# Patient Record
Sex: Female | Born: 1935 | Race: White | Hispanic: No | Marital: Married
Health system: Southern US, Community
[De-identification: ages and names within clinical notes are randomized; demographics above are authoritative.]

---

## 2006-03-19 ENCOUNTER — Inpatient Hospital Stay: Payer: Self-pay | Admitting: Internal Medicine

## 2006-03-19 ENCOUNTER — Other Ambulatory Visit: Payer: Self-pay

## 2006-03-20 ENCOUNTER — Other Ambulatory Visit: Payer: Self-pay

## 2006-03-21 ENCOUNTER — Other Ambulatory Visit: Payer: Self-pay

## 2006-03-22 ENCOUNTER — Other Ambulatory Visit: Payer: Self-pay

## 2007-03-27 ENCOUNTER — Emergency Department: Payer: Self-pay

## 2007-03-27 ENCOUNTER — Other Ambulatory Visit: Payer: Self-pay

## 2007-08-16 ENCOUNTER — Ambulatory Visit: Payer: Self-pay | Admitting: Family

## 2008-09-11 ENCOUNTER — Ambulatory Visit: Payer: Self-pay | Admitting: Family

## 2008-12-12 ENCOUNTER — Inpatient Hospital Stay: Payer: Self-pay | Admitting: Internal Medicine

## 2008-12-18 ENCOUNTER — Ambulatory Visit: Payer: Self-pay | Admitting: Internal Medicine

## 2009-07-01 ENCOUNTER — Ambulatory Visit: Payer: Self-pay | Admitting: Internal Medicine

## 2009-10-01 ENCOUNTER — Ambulatory Visit: Payer: Self-pay | Admitting: Family

## 2011-02-15 ENCOUNTER — Emergency Department: Payer: Self-pay | Admitting: Emergency Medicine

## 2011-09-14 ENCOUNTER — Ambulatory Visit: Payer: Self-pay | Admitting: Internal Medicine

## 2011-09-23 ENCOUNTER — Inpatient Hospital Stay: Payer: Self-pay | Admitting: Internal Medicine

## 2012-06-09 ENCOUNTER — Ambulatory Visit: Payer: Self-pay | Admitting: Internal Medicine

## 2012-06-20 ENCOUNTER — Inpatient Hospital Stay: Payer: Self-pay | Admitting: Internal Medicine

## 2012-06-20 LAB — PRO B NATRIURETIC PEPTIDE: B-Type Natriuretic Peptide: 12799 pg/mL — ABNORMAL HIGH (ref 0–450)

## 2012-06-20 LAB — TROPONIN I
Troponin-I: <0.02 ng/mL
Troponin-I: <0.02 ng/mL

## 2012-06-20 LAB — CBC
HCT: 39.4 % (ref 35.0–47.0)
MCHC: 33 g/dL (ref 32.0–36.0)
Platelet: 184 10*3/uL (ref 150–440)
RBC: 4.55 10*6/uL (ref 3.80–5.20)
WBC: 14.1 10*3/uL — ABNORMAL HIGH (ref 3.6–11.0)

## 2012-06-20 LAB — BASIC METABOLIC PANEL
Anion Gap: 11 (ref 7–16)
BUN: 20 mg/dL — ABNORMAL HIGH (ref 7–18)
Calcium, Total: 8.9 mg/dL (ref 8.5–10.1)
Chloride: 104 mmol/L (ref 98–107)
Co2: 25 mmol/L (ref 21–32)
Creatinine: 1.08 mg/dL (ref 0.60–1.30)

## 2012-06-20 LAB — CK TOTAL AND CKMB (NOT AT ARMC)
CK, Total: 50 U/L (ref 21–215)
CK, Total: 55 U/L (ref 21–215)
CK-MB: 0.8 ng/mL (ref 0.5–3.6)

## 2012-06-21 LAB — CBC WITH DIFFERENTIAL/PLATELET
Basophil #: 0 10*3/uL (ref 0.0–0.1)
Basophil %: 0.5 %
Eosinophil %: 1.2 %
HGB: 11.6 g/dL — ABNORMAL LOW (ref 12.0–16.0)
Lymphocyte %: 13 %
MCH: 28.8 pg (ref 26.0–34.0)
Monocyte #: 0.7 x10 3/mm (ref 0.2–0.9)
Monocyte %: 7.4 %
Neutrophil %: 77.9 %
Platelet: 151 10*3/uL (ref 150–440)
RBC: 4.04 10*6/uL (ref 3.80–5.20)
WBC: 9.2 10*3/uL (ref 3.6–11.0)

## 2012-06-21 LAB — BASIC METABOLIC PANEL
Anion Gap: 12 (ref 7–16)
BUN: 22 mg/dL — ABNORMAL HIGH (ref 7–18)
Chloride: 105 mmol/L (ref 98–107)
Creatinine: 1.08 mg/dL (ref 0.60–1.30)
EGFR (Non-African Amer.): 44 — ABNORMAL LOW
Glucose: 95 mg/dL (ref 65–99)
Potassium: 3.7 mmol/L (ref 3.5–5.1)
Sodium: 142 mmol/L (ref 136–145)

## 2012-06-21 LAB — HEMOGLOBIN A1C: Hemoglobin A1C: 5.8 % (ref 4.2–6.3)

## 2012-06-21 LAB — LIPID PANEL
Cholesterol: 91 mg/dL (ref 0–200)
Triglycerides: 89 mg/dL (ref 0–200)

## 2012-06-21 LAB — MAGNESIUM: Magnesium: 1.8 mg/dL

## 2012-08-09 ENCOUNTER — Ambulatory Visit: Payer: Self-pay | Admitting: Internal Medicine

## 2012-08-25 ENCOUNTER — Inpatient Hospital Stay: Payer: Self-pay | Admitting: Internal Medicine

## 2012-08-25 LAB — URINALYSIS, COMPLETE
Bilirubin,UR: NEGATIVE
Blood: NEGATIVE
Glucose,UR: NEGATIVE mg/dL (ref 0–75)
Hyaline Cast: 3
Ketone: NEGATIVE
Leukocyte Esterase: NEGATIVE
Ph: 7 (ref 4.5–8.0)
Specific Gravity: 1.009 (ref 1.003–1.030)
Squamous Epithelial: <1
WBC UR: <1 /HPF (ref 0–5)

## 2012-08-25 LAB — COMPREHENSIVE METABOLIC PANEL
Albumin: 2.7 g/dL — ABNORMAL LOW (ref 3.4–5.0)
Alkaline Phosphatase: 73 U/L (ref 50–136)
Anion Gap: 8 (ref 7–16)
BUN: 31 mg/dL — ABNORMAL HIGH (ref 7–18)
Calcium, Total: 8.4 mg/dL — ABNORMAL LOW (ref 8.5–10.1)
Co2: 27 mmol/L (ref 21–32)
EGFR (Non-African Amer.): 42 — ABNORMAL LOW
Glucose: 105 mg/dL — ABNORMAL HIGH (ref 65–99)
Osmolality: 277 (ref 275–301)
Potassium: 5.2 mmol/L — ABNORMAL HIGH (ref 3.5–5.1)
SGOT(AST): 48 U/L — ABNORMAL HIGH (ref 15–37)
SGPT (ALT): 13 U/L (ref 12–78)
Sodium: 135 mmol/L — ABNORMAL LOW (ref 136–145)

## 2012-08-25 LAB — CBC
HGB: 13.2 g/dL (ref 12.0–16.0)
MCHC: 33 g/dL (ref 32.0–36.0)
MCV: 84 fL (ref 80–100)
Platelet: 180 10*3/uL (ref 150–440)
RDW: 13.9 % (ref 11.5–14.5)
WBC: 15.1 10*3/uL — ABNORMAL HIGH (ref 3.6–11.0)

## 2012-08-25 LAB — TROPONIN I
Troponin-I: <0.02 ng/mL
Troponin-I: <0.02 ng/mL

## 2012-08-25 LAB — APTT: Activated PTT: 25.7 secs (ref 23.6–35.9)

## 2012-08-25 LAB — CK TOTAL AND CKMB (NOT AT ARMC)
CK, Total: 35 U/L (ref 21–215)
CK-MB: <0.5 ng/mL — ABNORMAL LOW (ref 0.5–3.6)

## 2012-08-25 LAB — PROTIME-INR: Prothrombin Time: 14.1 secs (ref 11.5–14.7)

## 2012-08-26 ENCOUNTER — Ambulatory Visit: Payer: Self-pay | Admitting: Internal Medicine

## 2012-08-26 LAB — BASIC METABOLIC PANEL
Anion Gap: 8 (ref 7–16)
BUN: 28 mg/dL — ABNORMAL HIGH (ref 7–18)
Calcium, Total: 8.2 mg/dL — ABNORMAL LOW (ref 8.5–10.1)
Chloride: 102 mmol/L (ref 98–107)
Co2: 28 mmol/L (ref 21–32)
EGFR (African American): 44 — ABNORMAL LOW
Glucose: 101 mg/dL — ABNORMAL HIGH (ref 65–99)
Osmolality: 281 (ref 275–301)

## 2012-08-28 LAB — BASIC METABOLIC PANEL
BUN: 24 mg/dL — ABNORMAL HIGH (ref 7–18)
Calcium, Total: 8.5 mg/dL (ref 8.5–10.1)
Co2: 29 mmol/L (ref 21–32)
Creatinine: 1.12 mg/dL (ref 0.60–1.30)
EGFR (African American): 48 — ABNORMAL LOW
EGFR (Non-African Amer.): 42 — ABNORMAL LOW
Glucose: 102 mg/dL — ABNORMAL HIGH (ref 65–99)
Potassium: 4.2 mmol/L (ref 3.5–5.1)
Sodium: 135 mmol/L — ABNORMAL LOW (ref 136–145)

## 2012-08-28 LAB — CBC WITH DIFFERENTIAL/PLATELET
Eosinophil #: 0.4 10*3/uL (ref 0.0–0.7)
Lymphocyte #: 1 10*3/uL (ref 1.0–3.6)
MCH: 27 pg (ref 26.0–34.0)
MCHC: 32.5 g/dL (ref 32.0–36.0)
MCV: 83 fL (ref 80–100)
Monocyte #: 0.6 x10 3/mm (ref 0.2–0.9)
Neutrophil %: 82 %
Platelet: 189 10*3/uL (ref 150–440)
RBC: 4.64 10*6/uL (ref 3.80–5.20)
RDW: 13.7 % (ref 11.5–14.5)

## 2012-08-28 LAB — MAGNESIUM: Magnesium: 2.1 mg/dL

## 2012-08-29 LAB — URINALYSIS, COMPLETE
Bilirubin,UR: NEGATIVE
Blood: NEGATIVE
Glucose,UR: NEGATIVE mg/dL (ref 0–75)
Hyaline Cast: 43
Ketone: NEGATIVE
Ph: 6 (ref 4.5–8.0)
Protein: NEGATIVE
RBC,UR: 1 /HPF (ref 0–5)
Squamous Epithelial: <1

## 2012-08-30 LAB — DIGOXIN LEVEL: Digoxin: 1.54 ng/mL

## 2012-08-30 LAB — CULTURE, BLOOD (SINGLE)

## 2012-10-18 ENCOUNTER — Ambulatory Visit: Payer: Self-pay | Admitting: Internal Medicine

## 2012-11-07 LAB — COMPREHENSIVE METABOLIC PANEL
Albumin: 3.3 g/dL — ABNORMAL LOW (ref 3.4–5.0)
BUN: 14 mg/dL (ref 7–18)
Bilirubin,Total: 0.6 mg/dL (ref 0.2–1.0)
Chloride: 100 mmol/L (ref 98–107)
Creatinine: 1.07 mg/dL (ref 0.60–1.30)
EGFR (African American): 51 — ABNORMAL LOW
EGFR (Non-African Amer.): 44 — ABNORMAL LOW
Glucose: 102 mg/dL — ABNORMAL HIGH (ref 65–99)
SGOT(AST): 24 U/L (ref 15–37)
SGPT (ALT): 15 U/L (ref 12–78)
Total Protein: 6.7 g/dL (ref 6.4–8.2)

## 2012-11-07 LAB — CBC
HCT: 39.3 % (ref 35.0–47.0)
MCV: 82 fL (ref 80–100)
RBC: 4.82 10*6/uL (ref 3.80–5.20)
WBC: 15.3 10*3/uL — ABNORMAL HIGH (ref 3.6–11.0)

## 2012-11-07 LAB — PRO B NATRIURETIC PEPTIDE: B-Type Natriuretic Peptide: 12328 pg/mL — ABNORMAL HIGH (ref 0–450)

## 2012-11-08 ENCOUNTER — Inpatient Hospital Stay: Payer: Self-pay | Admitting: Family Medicine

## 2012-11-08 LAB — URINALYSIS, COMPLETE
Bacteria: NONE SEEN
Bilirubin,UR: NEGATIVE
Blood: NEGATIVE
Glucose,UR: NEGATIVE mg/dL (ref 0–75)
Ketone: NEGATIVE
Leukocyte Esterase: NEGATIVE
Ph: 5 (ref 4.5–8.0)
Specific Gravity: 1.014 (ref 1.003–1.030)
WBC UR: 1 /HPF (ref 0–5)

## 2012-11-08 LAB — CBC WITH DIFFERENTIAL/PLATELET
Basophil %: 0.3 %
Eosinophil %: 0.4 %
Monocyte %: 7.4 %
Neutrophil #: 9.4 10*3/uL — ABNORMAL HIGH (ref 1.4–6.5)
Neutrophil %: 82.9 %
Platelet: 158 10*3/uL (ref 150–440)
RBC: 4.36 10*6/uL (ref 3.80–5.20)
WBC: 11.3 10*3/uL — ABNORMAL HIGH (ref 3.6–11.0)

## 2012-11-08 LAB — MAGNESIUM: Magnesium: 1.9 mg/dL

## 2012-11-08 LAB — TSH: Thyroid Stimulating Horm: 1.3 u[IU]/mL

## 2012-11-09 LAB — URINALYSIS, COMPLETE
Bilirubin,UR: NEGATIVE
Hyaline Cast: 3
Ketone: NEGATIVE
Ph: 6 (ref 4.5–8.0)
RBC,UR: 28 /HPF (ref 0–5)
Specific Gravity: 1.018 (ref 1.003–1.030)
Squamous Epithelial: 4
WBC UR: 40 /HPF (ref 0–5)

## 2012-11-09 LAB — CBC WITH DIFFERENTIAL/PLATELET
Basophil %: 0.5 %
Eosinophil #: 0 10*3/uL (ref 0.0–0.7)
Eosinophil %: 0.3 %
HCT: 36.2 % (ref 35.0–47.0)
HGB: 12.1 g/dL (ref 12.0–16.0)
Lymphocyte #: 0.9 10*3/uL — ABNORMAL LOW (ref 1.0–3.6)
Lymphocyte %: 7.1 %
Monocyte #: 0.8 x10 3/mm (ref 0.2–0.9)
Monocyte %: 6.4 %
RDW: 15.2 % — ABNORMAL HIGH (ref 11.5–14.5)
WBC: 12.8 10*3/uL — ABNORMAL HIGH (ref 3.6–11.0)

## 2012-11-09 LAB — BASIC METABOLIC PANEL
Anion Gap: 5 — ABNORMAL LOW (ref 7–16)
BUN: 16 mg/dL (ref 7–18)
Calcium, Total: 8.1 mg/dL — ABNORMAL LOW (ref 8.5–10.1)
Creatinine: 0.9 mg/dL (ref 0.60–1.30)
EGFR (African American): >60
EGFR (Non-African Amer.): 54 — ABNORMAL LOW
Glucose: 108 mg/dL — ABNORMAL HIGH (ref 65–99)
Osmolality: 276 (ref 275–301)
Potassium: 3.7 mmol/L (ref 3.5–5.1)
Sodium: 137 mmol/L (ref 136–145)

## 2012-11-09 LAB — URINE CULTURE

## 2012-11-09 LAB — LIPID PANEL
Cholesterol: 117 mg/dL (ref 0–200)
Ldl Cholesterol, Calc: 69 mg/dL (ref 0–100)
Triglycerides: 111 mg/dL (ref 0–200)
VLDL Cholesterol, Calc: 22 mg/dL (ref 5–40)

## 2012-11-10 LAB — CBC WITH DIFFERENTIAL/PLATELET
Basophil #: 0.1 10*3/uL (ref 0.0–0.1)
Basophil %: 0.6 %
Eosinophil #: 0 10*3/uL (ref 0.0–0.7)
HGB: 12 g/dL (ref 12.0–16.0)
MCH: 27.2 pg (ref 26.0–34.0)
MCHC: 32.9 g/dL (ref 32.0–36.0)
MCV: 83 fL (ref 80–100)
Monocyte #: 0.8 x10 3/mm (ref 0.2–0.9)
Neutrophil #: 10.2 10*3/uL — ABNORMAL HIGH (ref 1.4–6.5)
Neutrophil %: 83.9 %
RDW: 15.5 % — ABNORMAL HIGH (ref 11.5–14.5)

## 2012-11-10 LAB — BASIC METABOLIC PANEL
Anion Gap: 6 — ABNORMAL LOW (ref 7–16)
Calcium, Total: 8.1 mg/dL — ABNORMAL LOW (ref 8.5–10.1)
Co2: 30 mmol/L (ref 21–32)
EGFR (African American): >60
EGFR (Non-African Amer.): >60
Osmolality: 275 (ref 275–301)

## 2012-11-10 LAB — URINE CULTURE

## 2012-11-10 LAB — WBCS, STOOL

## 2012-11-11 LAB — CBC WITH DIFFERENTIAL/PLATELET
Basophil #: 0.1 10*3/uL (ref 0.0–0.1)
Eosinophil #: 0.1 10*3/uL (ref 0.0–0.7)
HGB: 11.5 g/dL — ABNORMAL LOW (ref 12.0–16.0)
Lymphocyte #: 1.1 10*3/uL (ref 1.0–3.6)
Lymphocyte %: 9.3 %
MCH: 26.7 pg (ref 26.0–34.0)
MCHC: 32.4 g/dL (ref 32.0–36.0)
Monocyte #: 1 x10 3/mm — ABNORMAL HIGH (ref 0.2–0.9)
Neutrophil %: 81 %
Platelet: 143 10*3/uL — ABNORMAL LOW (ref 150–440)
RBC: 4.32 10*6/uL (ref 3.80–5.20)
RDW: 15.4 % — ABNORMAL HIGH (ref 11.5–14.5)

## 2012-11-11 LAB — TROPONIN I: Troponin-I: 0.04 ng/mL

## 2012-11-12 LAB — CBC WITH DIFFERENTIAL/PLATELET
Basophil %: 0.5 %
Eosinophil #: 0.1 10*3/uL (ref 0.0–0.7)
HGB: 11.6 g/dL — ABNORMAL LOW (ref 12.0–16.0)
MCH: 27.3 pg (ref 26.0–34.0)
MCHC: 33.2 g/dL (ref 32.0–36.0)
MCV: 82 fL (ref 80–100)
Monocyte #: 0.8 x10 3/mm (ref 0.2–0.9)
Platelet: 138 10*3/uL — ABNORMAL LOW (ref 150–440)
RDW: 15.4 % — ABNORMAL HIGH (ref 11.5–14.5)

## 2012-11-12 LAB — BASIC METABOLIC PANEL
Anion Gap: 7 (ref 7–16)
Calcium, Total: 8 mg/dL — ABNORMAL LOW (ref 8.5–10.1)
Co2: 30 mmol/L (ref 21–32)
Creatinine: 0.78 mg/dL (ref 0.60–1.30)
EGFR (African American): >60
Glucose: 89 mg/dL (ref 65–99)
Sodium: 138 mmol/L (ref 136–145)

## 2012-11-13 ENCOUNTER — Ambulatory Visit: Payer: Self-pay | Admitting: Internal Medicine

## 2012-11-13 LAB — CULTURE, BLOOD (SINGLE)

## 2012-11-13 LAB — STOOL CULTURE

## 2012-11-21 ENCOUNTER — Emergency Department: Payer: Self-pay | Admitting: Emergency Medicine

## 2012-11-21 LAB — URINALYSIS, COMPLETE
Bacteria: NONE SEEN
Bilirubin,UR: NEGATIVE
Blood: NEGATIVE
Glucose,UR: NEGATIVE mg/dL (ref 0–75)
Nitrite: NEGATIVE
Protein: NEGATIVE
Specific Gravity: 1.006 (ref 1.003–1.030)

## 2012-11-21 LAB — COMPREHENSIVE METABOLIC PANEL
BUN: 13 mg/dL (ref 7–18)
Calcium, Total: 8.1 mg/dL — ABNORMAL LOW (ref 8.5–10.1)
Chloride: 101 mmol/L (ref 98–107)
Co2: 28 mmol/L (ref 21–32)
EGFR (African American): >60
EGFR (Non-African Amer.): 55 — ABNORMAL LOW
SGOT(AST): 20 U/L (ref 15–37)
SGPT (ALT): 15 U/L (ref 12–78)
Sodium: 135 mmol/L — ABNORMAL LOW (ref 136–145)

## 2012-11-21 LAB — DIGOXIN LEVEL: Digoxin: >5 ng/mL

## 2012-11-21 LAB — CBC
HGB: 12.3 g/dL (ref 12.0–16.0)
MCH: 26.3 pg (ref 26.0–34.0)
MCHC: 31.6 g/dL — ABNORMAL LOW (ref 32.0–36.0)
RDW: 14.9 % — ABNORMAL HIGH (ref 11.5–14.5)
WBC: 16.4 10*3/uL — ABNORMAL HIGH (ref 3.6–11.0)

## 2012-12-14 ENCOUNTER — Ambulatory Visit: Payer: Self-pay | Admitting: Internal Medicine

## 2012-12-14 DEATH — deceased

## 2014-06-24 IMAGING — CR DG CHEST 2V
1 series · 2 of 2 positions shown · non-contrast
Comparison: none

REASON FOR EXAM: H/O  Chf pneumonia  STAT Report [id number]
COMMENTS:

[Series 1: w chest pa · 0.14mm/px · 2 of 2 slices shown]
[im 1/2]
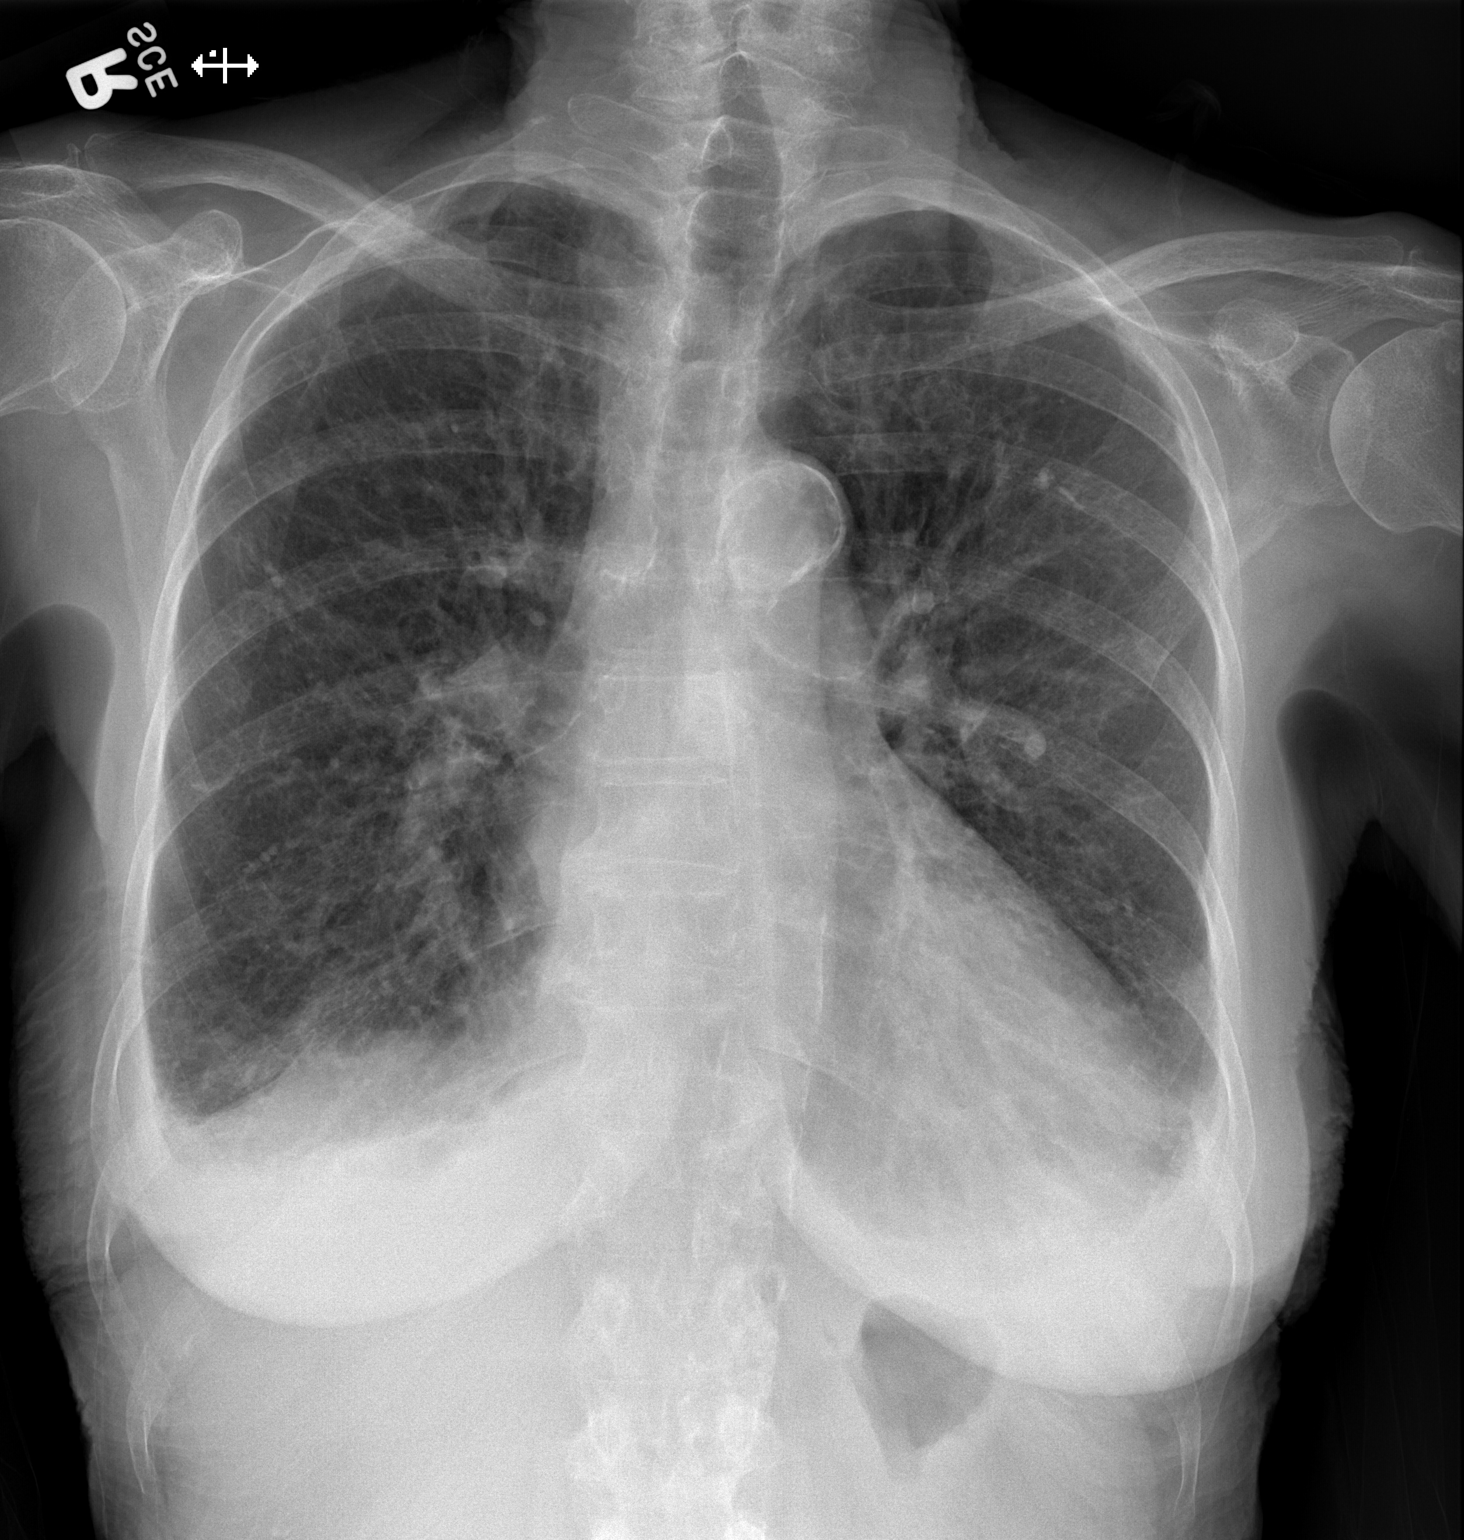
[im 2/2]
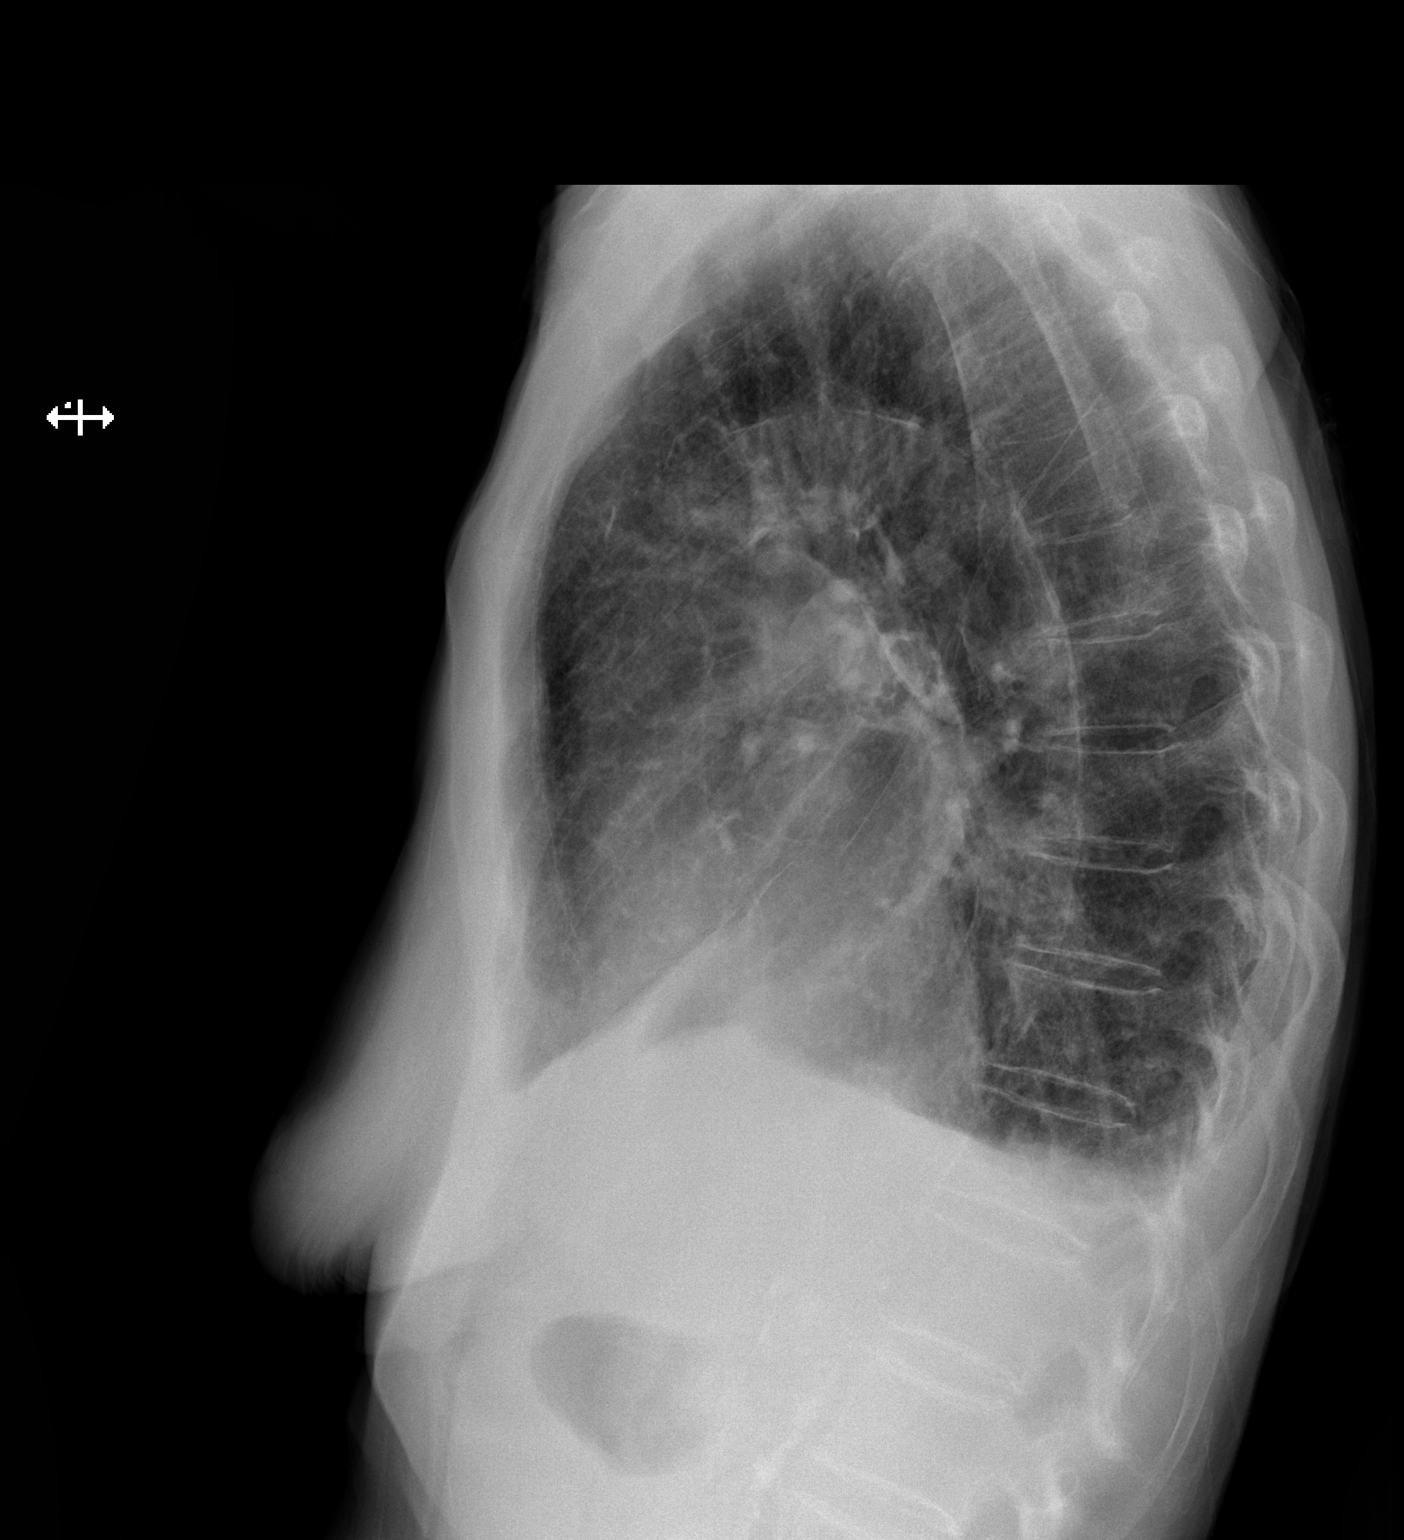

[2 of 2 positions shown; findings below may reference images not displayed]

PROCEDURE:     DXR - DXR CHEST PA (OR AP) AND LATERAL  - October 18, 2012  [DATE]

RESULT:     Comparison is made to the study 27 August, 2012.

The cardiac silhouette is enlarged. There are small bilateral pleural
effusions. The interstitial markings are diffusely prominent. This could be
secondary to chronic underlying pulmonary disease. The pleural effusion is
slightly smaller than seen previously. Atherosclerotic calcification is
present. No definite infiltrate is seen.
IMPRESSION: 1. Findings suggestive of COPD with chronic interstitial lung disease. Edema
would be difficult to exclude. There is stable cardiomegaly. Slightly
decreased small bilateral pleural effusions are noted.

[REDACTED]

## 2014-07-18 IMAGING — CT CT CHEST-ABD W/ CM
1 of 3 series · 12 of 31 positions shown, 18 images · non-contrast
Comparison: none

REASON FOR EXAM: abdominal pain epigastric.;    NOTE: Nursing to Give
Oral CT Contrast
COMMENTS:

[Series 2: soft tissue · axial · 0.66mm/px · z∈[-698,-326]mm · 12 of 150 slices shown, 18 images]
[im 13/150  mediastinal]
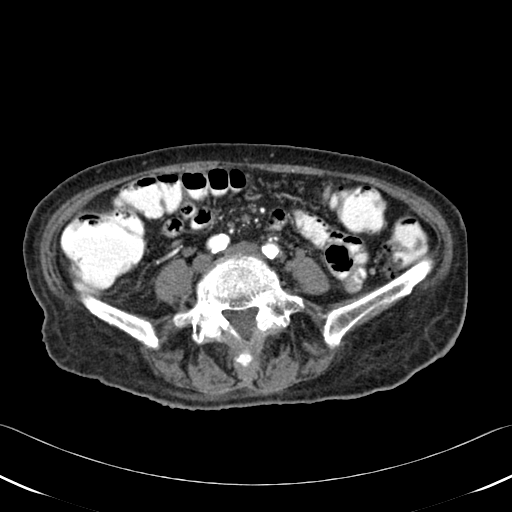
[im 13/150  bone]
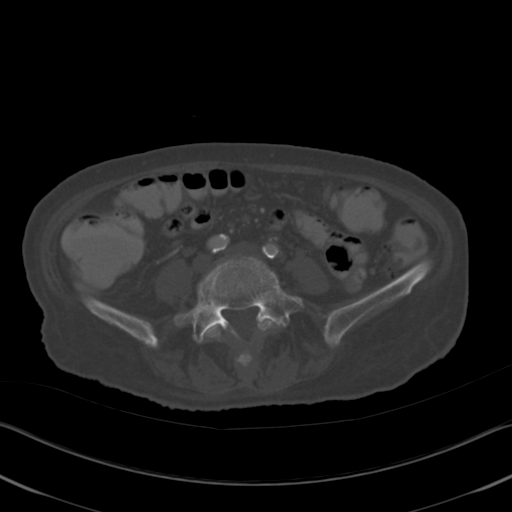
[im 25/150  mediastinal]
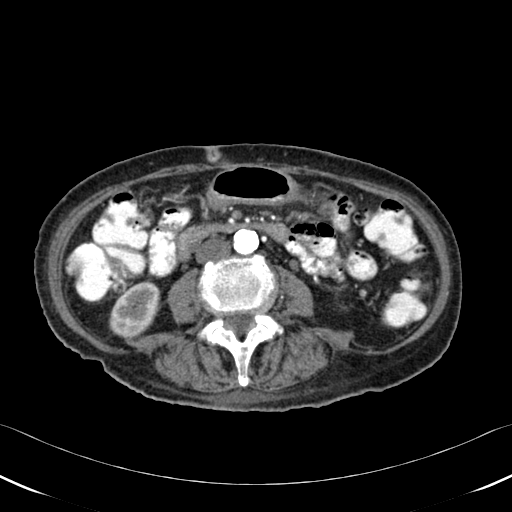
[im 38/150  mediastinal]
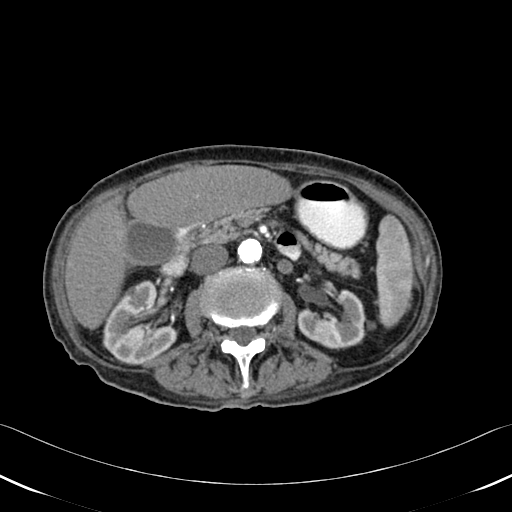
[im 50/150  mediastinal]
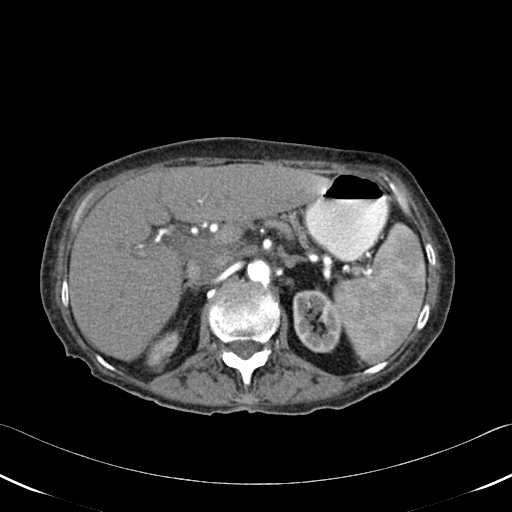
[im 63/150  mediastinal]
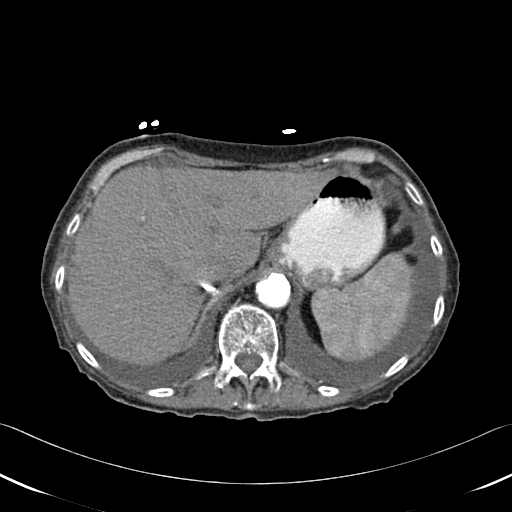
[im 72/150  mediastinal]
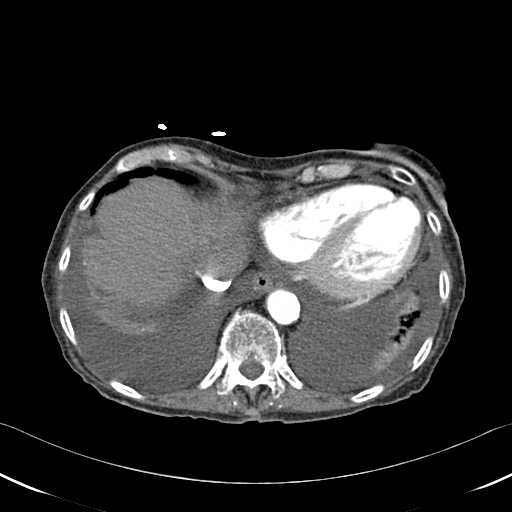
[im 75/150  mediastinal]
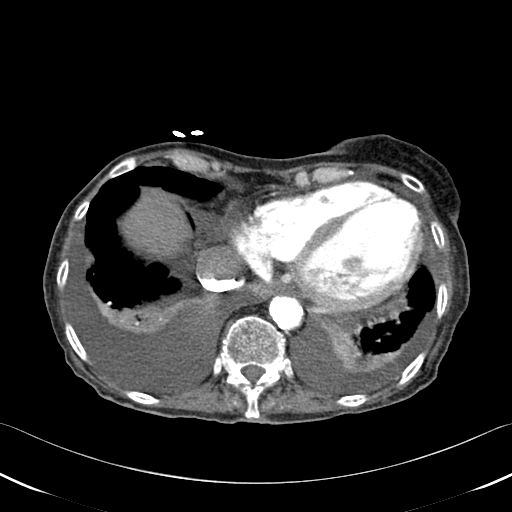
[im 87/150  mediastinal]
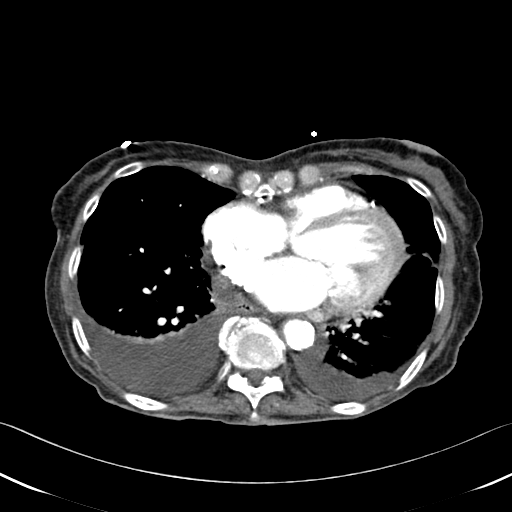
[im 100/150  mediastinal]
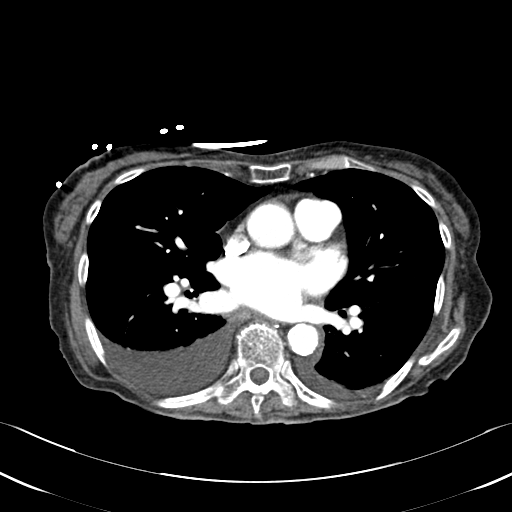
[im 100/150  lung]
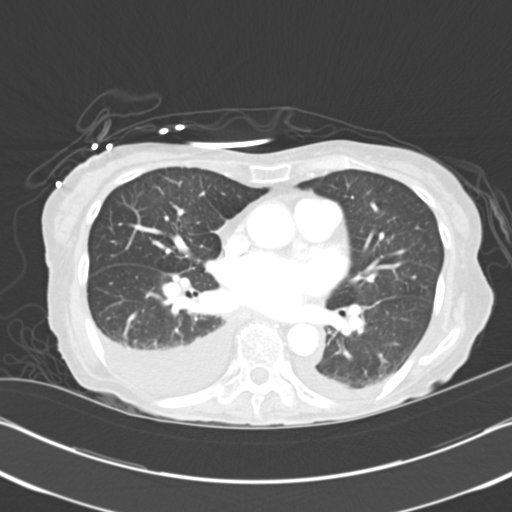
[im 100/150  bone]
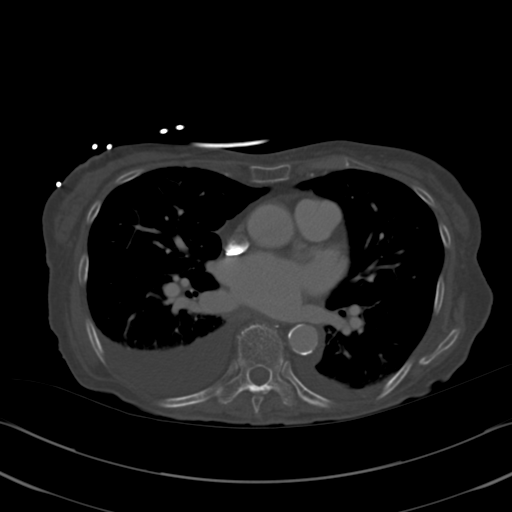
[im 112/150  mediastinal]
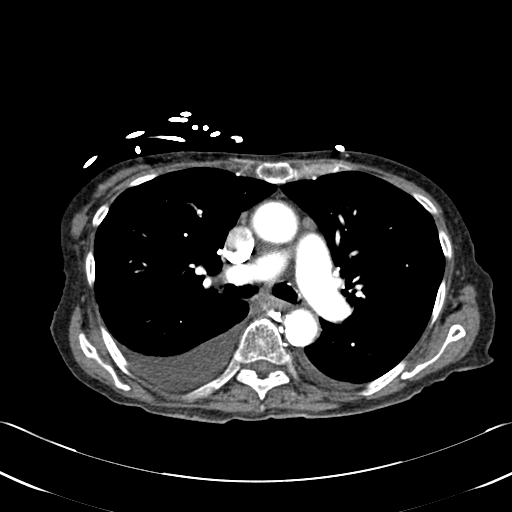
[im 112/150  lung]
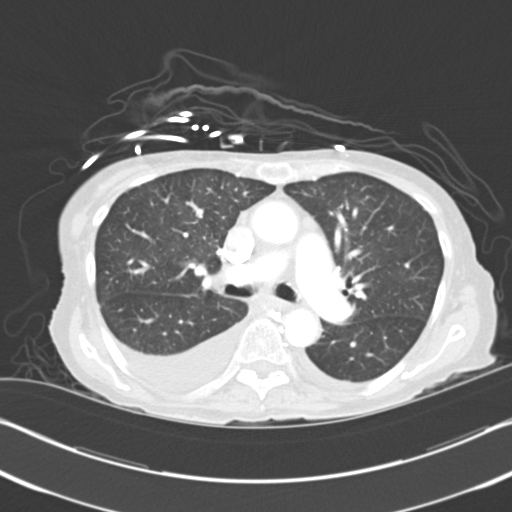
[im 125/150  mediastinal]
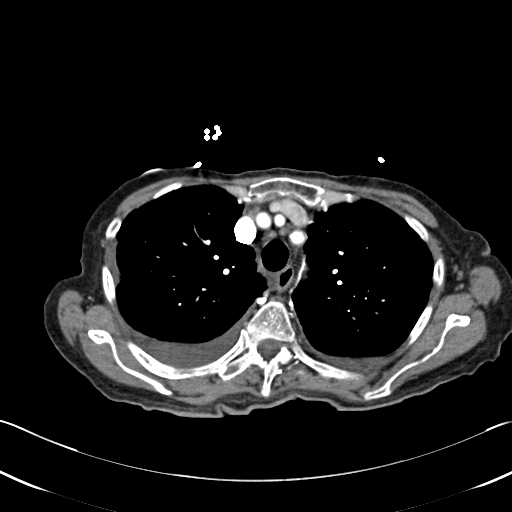
[im 125/150  lung]
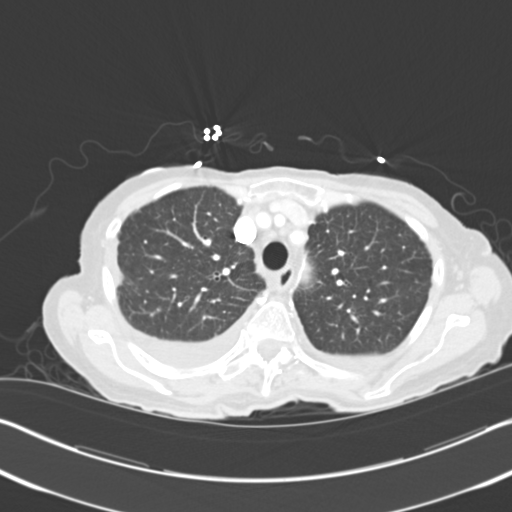
[im 137/150  mediastinal]
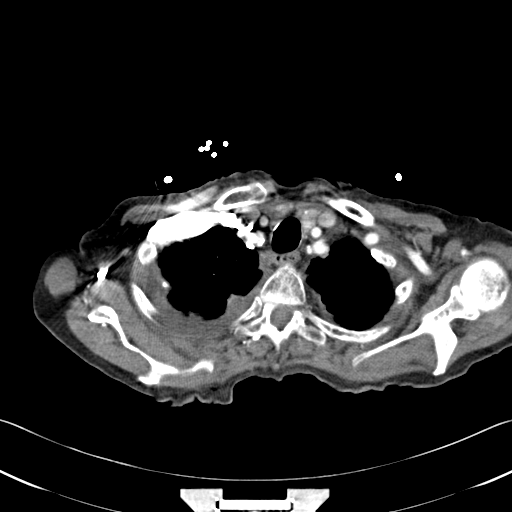
[im 137/150  lung]
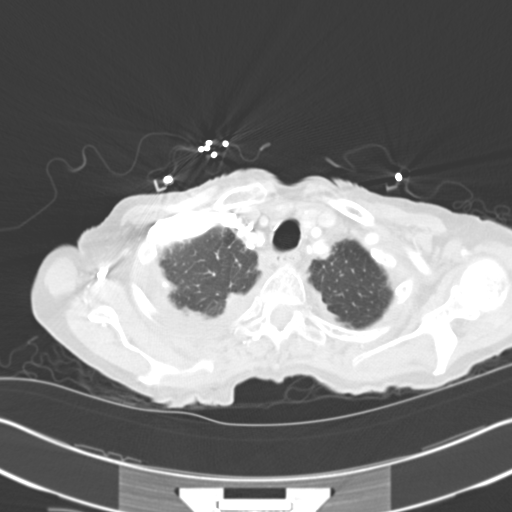

[12 of 31 positions shown; findings below may reference images not displayed]

PROCEDURE:     CT  - CT CHEST AND ABDOMEN W  - November 11, 2012 [DATE]

RESULT:     Axial CT scanning was performed through the chest and abdomen
with reconstructions at 3 mm intervals and slice thicknesses. The patient
received 85 cc of Isovue-V11 and also received oral contrast material.
Review of multiplanar reconstructed images was performed separately on the
VIA monitor.

CT scan of the chest:

At mediastinal window settings there are bilateral pleural effusions. The
effusion on the left is small in the and on the right it is moderate in
size. The cardiac chambers are enlarged. There is no pericardial effusion.
The caliber of the thoracic aorta is normal. Contrast within the pulmonary
arterial tree is normal in appearance. There is prominence of precarinal
lymph nodes with maximal measured dimension of approximately 2 cm
demonstrated best on image 36.

There is a pectus excavatum type chest contour. The thoracic vertebral
bodies are preserved in height.

At lung window settings there is minimal atelectasis adjacent to the
bilateral pleural effusions. There is no alveolar infiltrate. No suspicious
pulmonary parenchymal masses are demonstrated.
CONCLUSION: 1. There is a small left pleural effusion and a moderate sized right pleural
effusion. A small amount of bibasilar atelectasis in the lung parenchyma
adjacent to the pleural fluid is present bilaterally
2. The cardiac chambers are enlarged. There is mild prominence of precarinal
lymph nodes without bulky lymphadenopathy.

CT scan of the abdomen: The liver exhibits mildly decreased density
consistent with fatty infiltration. There is no focal mass or ductal
dilation. Numerous coarse calcifications are present consistent with
previous granulomatous disease. The spleen is top normal in size with
maximal measured dimension of approximately 10 cm seen in the longitudinal
plane. The gallbladder is adequately distended with no evidence of stones or
surrounding inflammatory change. The pancreas, partially distended stomach,
adrenal glands, and kidneys are normal in appearance. There are
subcentimeter hypodensities associated with the cortex of both kidneys
compatible with cysts. No stones are demonstrated. The partially
contrast-filled loops of small and large bowel appear normal where
visualized. The abdominal aorta exhibits mild failure to taper of its
caliber. The maximal measured diameter is 2.3 cm. The visualized portions of
the lumbar spine appear normal.
IMPRESSION: 1. Please see the discussion above regarding findings in the thorax.
2. Within the abdomen no acute abnormality is demonstrated. There are
chronic findings as described above.

[REDACTED]

## 2015-04-02 NOTE — Consult Note (Signed)
PATIENT NAME:  Sonia Carter, Sonia Carter MR#:  [id number] DATE OF BIRTH:  07/08/1915  DATE OF CONSULTATION:  08/26/2012  REFERRING PHYSICIAN:   CONSULTING PHYSICIAN:  Laurier NancyShaukat A. Khan, MD  INDICATION FOR CONSULTATION: Congestive heart failure.   HISTORY OF PRESENT ILLNESS: This is an 81 year old pleasant white female who is normally followed by Dr. Corky DownsJaved Masoud who came into the hospital yesterday with symptoms of weakness. She was lying in her bed and felt very weak and short of breath and felt like she was going to pass out. She called her son and then he called EMS and was brought to the Emergency Room. She denies any chest pain or shortness of breath right now. She still feels weak. She denies PND or orthopnea. She has past medical history of congestive heart failure with an ejection fraction of 35%, history of hypertension, history of GI reflux, and history of coronary artery disease status post coronary artery bypass [facility] in [facility] when she had also a myocardial infarction in 1982.   HOME MEDICATIONS:  1. Aspirin 81 mg. 2. Metoprolol 25 mg twice a day. 3. Protonix 40 mg daily.  4. Spironolactone 25 mg daily. 5. Lasix 40 mg twice a day.  6. Simvastatin 20 mg daily.   SOCIAL HISTORY: Unremarkable.   ALLERGIES: No known drug allergies.  FAMILY HISTORY: She has no significant family history.   PHYSICAL EXAMINATION:   GENERAL: She is alert and oriented right now, in no acute distress.   VITAL SIGNS: Temperature 98.1, pulse 106, respirations 20, blood pressure 77/48, and saturation 95.   NECK: No jugular venous distention. No carotid bruit.   LUNGS: Clear.   HEART: Irregular. Normal S1 and S2. No audible murmur.   ABDOMEN: Soft and nontender. Positive bowel sounds.   EXTREMITIES: No pedal edema. Some ecchymosis.   NEUROLOGIC: She appears to be alert and oriented.   LABS/STUDIES: EKG shows sinus rhythm at 98 beats per minute with frequent atrial premature contractions and  bigeminy and frequent PVCs. She had sinus tachycardia when she first came into the hospital. ____________________________ Laurier NancyShaukat A. Khan, MD sak:slb D: 08/26/2012 08:55:14 ET T: 08/26/2012 09:21:54 ET JOB#: [id number]  cc: Laurier NancyShaukat A. Khan, MD, <Dictator> Laurier NancySHAUKAT A KHAN MD ELECTRONICALLY SIGNED 09/19/2012 15:06

## 2015-04-02 NOTE — Consult Note (Signed)
    Comments   I spoke with pt's son, Floyde ParkinsJohn Balaban. Son hopes that pt will be able to return home at discharge and is in agreement with hospice following pt. Even if pt has to go for STR, he would like hospice to follow her once she is able to return home. Will order hospice screen.  says that pt's main complaint has been weakness and pt says the same thing. Both feel that pt is on too many medications. Pt's BP on admission was 72/40. During hospitalization 06/2012, SBP's were in the 90's. She is currently on Lasix 40mg  bid (recently increased), metoprolol 25mg  bid, spironolactone 25mg  daily and a NTG patch. I suspect some of her medications should be stopped or the dose decreased. Would suggest lasix daily in AM so that pt is not awakened throughout the night to urinate (one of her complaints per son) which is also a fall risk. Also note that she is on a statin with an LDL cholesterol of 40. The longterm benefit of continuing this in a 81 yo woman is questionable.   Electronic Signatures: Jaycee Pelzer, Harriett SineNancy (MD)  (Signed 13-Sep-13 15:28)  Authored: Palliative Care   Last Updated: 13-Sep-13 15:28 by Colum Colt, Harriett SineNancy (MD)

## 2015-04-02 NOTE — H&P (Signed)
PATIENT NAME:  Sonia Carter, Sonia Carter MR#:  [id number] DATE OF BIRTH:  03/10/1915  DATE OF ADMISSION:  11/08/2012  HISTORY OF PRESENT ILLNESS: The patient is a 81 year old pleasant Caucasian female with a past medical history of cardiomyopathy, chronic atrial fibrillation, congestive heart failure, coronary artery disease status post stent placement, aortic stenosis, and gastroesophageal reflux disease who is presenting to the ER with a chief complaint of generalized weakness and dizzy spells. The patient was brought into the ER for generalized weakness and fainting episode. The patient's BNP is elevated by ER blood work which is in the 12,000 range and also when compared to the old BNP is also elevated to the same range. Chest x-ray has revealed worsening right pleural effusion and underlying infiltrates cannot be ruled out. At one point the patient became hypotensive and IV fluids were initiated. Chest x-ray has revealed worsening of right pleural effusion. During my examination she is not quite sure how she is feeling. Denies any coughing. She was feeling dizzy and almost fainted. The patient lives alone but family members check on her. As the patient was feeling extremely weak, they brought her into the ER. The patient denies any chest pain or abdominal pain. Denies any nausea, vomiting, or diarrhea. Complaining of shortness of breath associated with weakness. In the ER the patient was started on empiric antibiotics with IV Rocephin and Zithromax for possible pneumonia.   PAST MEDICAL HISTORY:  1. Aortic stenosis. 2. Atrial fibrillation. 3. Cardiomyopathy. 4. Congestive heart failure. 5. Coronary artery disease, status post stent placement.  6. History of gastroesophageal reflux disease.   PAST SURGICAL HISTORY:  1. Stent placement. 2. Hemorrhoidectomy.   ALLERGIES: The patient has no known drug allergies.   HOME MEDICATIONS:  1. Coreg 3.125 mg twice a day. 2. Digoxin 0.25 mg tablets once a day up  to one month.  3. Aspirin 81 mg once daily.  4. Lasix 20 mg tablets once daily. 5. Spironolactone 25 mg tablets once a day.  6. Zofran 4 mg tablets on as needed basis.   PSYCHOSOCIAL HISTORY: Lives alone at home. Family members lives close by and they keep a check on her.   REVIEW OF SYSTEMS: CONSTITUTIONAL: The patient complains of fatigue and weakness but denies any fever, weight loss or weight gain. EYES: Denies any blurry vision, glaucoma, hearing loss, allergies. ENT: Denies tinnitus, postnasal drip, snoring. RESPIRATORY: Denies any cough. No wheezing. Denies any history of tuberculosis. CARDIOVASCULAR: Denies any chest pain, orthopnea, arrhythmias. GI: No hematemesis or melena. GENITOURINARY: The patient denies any dysuria or hematuria. ENDOCRINOLOGY: Denies polyuria, polydipsia, polyphagia. No thyroid problems. HEMATOLOGIC/LYMPHATIC: No easy bruising, bleeding, or swollen glands. INTEGUMENTARY: Denies acne, rash, lesions. MUSCULOSKELETAL: Denies any pain in the neck, back, shoulder, or knee. NEUROLOGIC: The patient is hard of hearing but denies any numbness, weakness, dysarthria, epilepsy, tremors, vertigo, ataxia. PSYCH: Has insomnia but denies ADD, OCD, bipolar disorder, depression.   PHYSICAL EXAMINATION:   VITAL SIGNS: Temperature 98.2, pulse 79, respiratory rate 22, blood pressure 107/54.   GENERAL APPEARANCE: Not in acute distress resting comfortably.   HEENT: Normocephalic, atraumatic. Pupils are equally reacting to light and accommodation.   NECK: Supple. No JVD. No thyromegaly.   LUNGS: Moderate air entry. Decreased air supply in right lower lung base.   GI: Soft. Bowel sounds are positive in all four quadrants. Nontender, nondistended.   NEUROLOGIC: Awake, alert, oriented x3, answering questions appropriately but very, very hard of hearing.   EXTREMITIES: Trace edema is present.  LABS AND IMAGING STUDIES: Chest x-ray has revealed worsening of right pleural effusion with  possible underlying infiltrate.   The patient's urinalysis has revealed negative nitrate, negative leukocyte esterase, bilirubin negative, ketones negative. Sodium 102, BUN 14, creatinine 1.07, sodium 135, potassium 3.8, chloride 100, CO2 27, calcium 8.2, bilirubin total 0.6.   ASSESSMENT AND PLAN: This is a 81 year old Caucasian female brought in to the ER after feeling weak and tired.  1. Worsening right-sided pleural effusion, possibly underlying pneumonia. Admit to Med/Surg floor. Will give her IV Rocephin and Zithromax.  2. Leukocytosis with dizziness probably from #1. Will provide her with gentle hydration with IV fluids. Will watch the symptoms and signs for congestive heart failure as the patient has history of congestive heart failure. Will try to obtain sputum culture and sensitivity. Resume all her home medications. 3. History of congestive heart failure. Continue her home medication. The patient is not fluid overloaded.  4. Coronary artery disease status post stent placement. The patient denies any chest pain. Continue current management.  5. Will provide the patient with GI and DVT prophylaxis.   CODE STATUS: She has requested her CODE STATUS to be DO NOT RESUSCITATE.  Admission and plan of care was discussed with the patient in detail and she is aware of the plan.   TOTAL TIME SPENT ON ADMISSION: 60 minutes.   ____________________________ Ramonita LabAruna Shenandoah Yeats, MD ag:drc D: 11/08/2012 01:40:53 ET T: 11/08/2012 08:23:45 ET JOB#: [id number]  cc: Ramonita LabAruna Braden Deloach, MD, <Dictator> Corky DownsJaved Masoud, MD Ramonita LabARUNA Bruin Bolger MD ELECTRONICALLY SIGNED 11/09/2012 6:20

## 2015-04-02 NOTE — H&P (Signed)
PATIENT NAME:  Sonia Carter, Sonia Carter MR#:  [id number] DATE OF BIRTH:  1914-12-30  DATE OF ADMISSION:  08/25/2012  PRIMARY CARE PHYSICIAN: Sonia DownsJaved Masoud, MD  CHIEF COMPLAINT: I felt all over weak and could not move.   HISTORY OF PRESENT ILLNESS: [name] is a 81 year old pleasant Caucasian female who comes to the Emergency Room after she woke up around 3 o'clock  feeling weak and could not move in her bed. Per son she called him and he went to check on her. The patient appeared very weak, called EMS, brought her here and she was found to be in congestive heart failure. The patient was tachypneic and hypoxic initially. She was put on BiPAP for several hours in the Emergency Room and currently is 100% on 2 liters nasal cannula oxygen. Her BiPAP has been removed at this time. She denies any shortness of breath, denies any dietary discretion. She is being admitted for further evaluation and management.   PAST MEDICAL HISTORY:  1. Chronic congestive heart failure, systolic, ejection fraction around 35%.  2. Hypertension.  3. Gastroesophageal reflux disease.  4. Coronary artery disease.   HOME MEDICATIONS: 1. Aspirin 81 mg daily.  2. Metoprolol 25 mg twice a day. 3. Protonix 40 mg daily.  4. Spironolactone 25 mg daily.  5. Lasix 40 mg twice a day; this was recently increased about 1-1/2 weeks ago by Dr. Corky DownsJaved Carter, per son.  6. Simvastatin 20 mg daily.  7. Nitroglycerin patch 0.2 mg transdermally daily; this was also started about 1-1/2 weeks ago.    ALLERGIES: Eucalyptus oil.   SOCIAL HISTORY: The patient lives at home by herself. She ambulates using a walker. She has a son who checks on her. She denies any history of smoking.   PAST SURGICAL HISTORY: Hemorrhoid surgery.   FAMILY HISTORY: From old records, mother died from pneumonia and father died of old age.   REVIEW OF SYSTEMS: CONSTITUTIONAL: No fever. Positive for fatigue and weakness. EYES: No blurred or double vision or cataracts. ENT:  No tinnitus, ear pain, or hearing loss. RESPIRATORY: There is mild cough, no wheeze. Positive for shortness of breath. CARDIOVASCULAR: No chest pain or orthopnea. Positive for dyspnea on exertion. GASTROINTESTINAL: No nausea, vomiting, diarrhea, or abdominal pain. GU: No dysuria or hematuria. ENDOCRINE: No polyuria or nocturia. HEMATOLOGY: No anemia or easy bruising. SKIN: No acne or rash. MUSCULOSKELETAL: Positive for arthritis. NEUROLOGIC: No cerebrovascular accident or transient ischemic attack. PSYCH: No anxiety or depression. All other systems reviewed and negative.   PHYSICAL EXAMINATION:   GENERAL: The patient is awake, alert, and oriented x3. She is thin, cachetic, not in acute distress.   VITAL SIGNS: She is afebrile, pulse 88, blood pressure 81/52, and saturation 94% on 2 liters.   HEENT: Atraumatic, normocephalic. Pupils are equal, round, and reactive to light and accommodation. Extraocular movements intact. Oral mucosa is moist.   NECK: Supple. Mild JVD. No carotid bruit.   RESPIRATORY: Decreased breath sounds in bilateral bases. Few bibasilar crackles heard. Systolic murmur present in the left sternal area. PMI is not lateralized. Chest is nontender.   EXTREMITIES: Good pedal pulses. Good femoral pulses. Trace lower extremity edema.   ABDOMEN: Soft, benign, and nontender. No organomegaly. Positive bowel sounds.   NEUROLOGIC: Grossly intact cranial nerves II through XII. No motor or sensory deficits.   PSYCH: The patient is awake, alert, and oriented x3.   LABORATORY AND RADIOLOGIC DATA: Urinalysis is negative for urinary tract infection.   Chest x-ray is consistent  with congestive heart failure.   B-type natriuretic peptide is 23,888. Lactic acid is 2.1. White count 15,000, hemoglobin and hematocrit 13.2 and 39.9, and platelet count 180. Sodium 135, creatinine 1.11, glucose 105, and potassium 5.2. LFTs are within normal limits, except SGOT of 48 and albumin is 2.7. Cardiac  enzymes, first set, is negative.   ASSESSMENT: 81 year old [name] with history of hypertension and cardiomyopathy with ejection fraction of 35% whose last admission was in July 2013 admitted for CHF exacerbation.  1. Acute on chronic congestive heart failure exacerbation, systolic dysfunction, last known ejection fraction around 35%. Chest x-ray is with some persistent congestive heart failure changes. She also has some chronic fibrotic changes on chest x-ray. We will start the patient on IV Lasix 20 mg twice a day and advise regarding dietary compliance with low salt diet. We will change her metoprolol to Coreg 3.125 mg twice a day given her low blood pressure. Her Aldactone will be held given her high potassium. She is also on aspirin and statin which we will continue. Cycle cardiac enzymes x3. Continue oxygen at this time.  2. Relative hypotension in the setting of congestive heart failure. We will hold off blood pressure medications other than start the patient on low dose Coreg.  3. Coronary artery disease. Appears stable. Continue aspirin and statin.  4. History of paroxysmal atrial fibrillation, on metoprolol, and not on any anticoagulation by Dr. Juel Carter due to her age and risk of fall. Her Plavix was recently discontinued. She has been on nitro patch, which I will hold because of hypotension. We will continue aspirin for now.  5. Mild leukocytosis. Appears reactive. There is no clinical evidence of pneumonia. The patient is afebrile and not having any productive cough. We will continue to monitor and hold off on antibiotics for now.  6. GERD. Continue Protonix.  7. Deep vein thrombosis prophylaxis with subcutaneous heparin.   Further work-up according to the patient's clinical course. The hospital admission plan was discussed with the patient and the patient's son who is agreeable to it.        CODE STATUS: NO CODE, DO NOT RESUSCITATE.   TIME SPENT: 50  minutes. ____________________________ Sonia Hail Allena Katz, MD sap:slb D: 08/25/2012 13:12:15 ET T: 08/25/2012 13:43:56 ET JOB#: [id number]  cc: Sonia Carter A. Allena Katz, MD, <Dictator> Sonia Downs, MD Willow Ora MD ELECTRONICALLY SIGNED 08/29/2012 22:17

## 2015-04-02 NOTE — Discharge Summary (Signed)
PATIENT NAME:  Sonia Carter, Sonia Carter MR#:  [id number] DATE OF BIRTH:  11/05/1915  DATE OF ADMISSION:  08/25/2012 DATE OF DISCHARGE:  08/30/2012   ADMITTING PHYSICIAN: Enedina Finner, MD    DISCHARGING PHYSICIAN: Enid Baas, MD    PRIMARY MD: Corky Downs, MD   CONSULTATIONS IN THE HOSPITAL:  1. Cardiology consultation by Dr. Adrian Blackwater  2. Palliative Care consultation by Dr. Harvie Junior    DISCHARGE DIAGNOSES:  1. Acute on chronic congestive heart failure exacerbation with systolic dysfunction, EF less than 25%.  2. Generalized weakness due to low ejection fraction.  3. Atrial fibrillation.  4. Baseline low normal blood pressure.  5. Coronary artery disease.  6. Gastroesophageal reflux disease.  7. Severe aortic stenosis.   DISCHARGE MEDICATIONS:  1. Aspirin 81 mg p.o. daily.  2. Furosemide 20 mg p.o. b.i.d. in the morning at 9 a.m. and in the evening at 5 p.m.  3. Digoxin 125 mcg p.o. daily.  4. Carvedilol 6.25 mg p.o. b.i.d.  5. Vitamin B Complex with Vitamin C and folic acid 1 tablet p.o. daily.   DISCHARGE DIET: Low sodium diet.   DISCHARGE ACTIVITY: As tolerated.   FOLLOW-UP INSTRUCTIONS: Follow-up with Dr. Adrian Blackwater in 1 to 2 weeks.   REFERRAL: Physical therapy.   Also of note, the patient's normal blood pressure runs low with systolic usually in the 80 to 90 range so it is not contraindicated for physical therapy as long as she is not symptomatic.  LABORATORY, DIAGNOSTIC, AND RADIOLOGICAL DATA: Serum Digoxin level 1.54. Urinalysis negative for any infection. Sodium 135, potassium 4.2, chloride 100, bicarb 29, BUN 24, creatinine 1.1, glucose 102, calcium 8.5. Magnesium 2.1. WBC 12.0, hemoglobin 12.5, hematocrit 38.5, platelet count 189.   Chest x-ray on the 14th showed interstitial edema, slightly decreased when compared to prior. Small bilateral pleural effusions are present.   Echo Doppler showing severely dilated left atrium, left ventricle with severe LV  dysfunction, moderate mitral regurgitation, aortic regurgitation, severe aortic stenosis, moderate left pleural effusion is present. EF is less than 25%.   Blood cultures remained negative while in the hospital.   BRIEF HOSPITAL COURSE: Sonia Carter is a 81 year old elderly Caucasian female with past medical history significant for atrial fibrillation, congestive heart failure with prior ejection fraction of 35% who presented to the hospital secondary to generalized weakness and also respiratory distress and was found to be in CHF exacerbation with BNP elevated more than 20,000. She was initially placed on BiPAP and weaned down to nasal cannula. 1. Acute on chronic systolic CHF exacerbation. Repeat echo showed EF less than 25%. She has very low blood pressure and very sensitive to diuretics. Her Lasix dose has been adjusted to 20 b.i.d. The second dose in the evening should be given a littler earlier than bedtime to avoid nocturia. The patient cannot be on any lisinopril secondary to hypotension. She is on aspirin at this time and also Coreg for rate control for her atrial fibrillation. She was seen by Dr. Adrian Blackwater while in the hospital and would like to follow-up with him even as an outpatient also so appointment should be scheduled for the next 1 to 2 weeks.  2. Atrial fibrillation, rate controlled, currently on Coreg low dose. Digoxin level is within normal limits prior to discharge. 3. Generalized weakness. Urinalysis was negative. Blood cultures are negative. No source of any infection. Even with the blood pressure trending up, she is feeling overall weak. Not sure if this is just from her  low ejection fraction, poor systolic function, or atrial fibrillation. She did work with physical therapy who recommended rehab so the patient will be going to rehab at this time. Also, polypharmacy should be avoided in this elderly patient so she is only on medications that she really needs at this time.  CODE  STATUS: DO NOT RESUSCITATE as discussed by Palliative Care prior to discharge.   TIME SPENT ON DISCHARGE: 45 minutes.   DISCHARGE CONDITION: Stable.   DISCHARGE DISPOSITION: Short-term rehab.   ____________________________ Enid Baasadhika Ishmael Berkovich, MD rk:drc D: 08/30/2012 13:52:51 ET T: 08/30/2012 14:21:03 ET JOB#: [id number]  cc: Enid Baasadhika Timiyah Romito, MD, <Dictator> Laurier NancyShaukat A. Khan, MD Corky DownsJaved Masoud, MD Enid BaasADHIKA Alias Villagran MD ELECTRONICALLY SIGNED 08/30/2012 17:08

## 2015-04-02 NOTE — Discharge Summary (Signed)
PATIENT NAME:  Sonia Carter, Sonia Carter MR#:  [id number] DATE OF BIRTH:  02-07-1915  DATE OF ADMISSION:  11/08/2012 DATE OF DISCHARGE:  11/13/2012  REASON FOR ADMISSION: Generalized weakness and fainting with elevated white blood cells and becoming hypotensive.   FINAL DIAGNOSES:  1. Symptomatic urinary tract infection.  2. Congestive heart failure, systolic dysfunction, with an ejection fraction less than 25%, now compensated.  3. History of aortic stenosis.  4. Atrial fibrillation.  5. Cardiomyopathy.  6. Coronary artery disease status post stents.  7. History of gastroesophageal reflux.   8. Nausea.  9. Moderate malnutrition.  10. Poor appetite.  11. Leukocytosis due to urinary tract infection, now resolved.  12. Weakness and debility.  13. Diarrhea due to medications.  14. Relative hypotension in the setting of congestive heart failure.  15. Low HDL.  16. Thrombocytopenia, likely consumptive.  17. Thrush.   LABORATORY, DIAGNOSTIC, AND RADIOLOGICAL DATA: BNP 12,000 but no signs of acute exacerbation. Glucose with normal limits. Creatinine 0.78, calcium 8.1, sodium 138, potassium 4.0 at discharge. Cardiac enzymes negative. Thyroid stimulating hormone 1.3. White blood cells of 15,000 at admission and 10.6 at discharge. Hemoglobin of 11.6 at discharge. Platelets 138 at discharge. C. difficile negative. Urine cultures mixed bacteria. Urinalysis repeated second time showed 40 white blood cells, 28 red blood cells, leukocyte esterase +3, nitrite negative.   EKG atrial fibrillation, left axis deviation, previous septal infarct.   CT of chest and abdomen showed small left pleural effusion and moderate right side pleural effusion, small amount of bibasilar atelectasis in the lung parenchyma adjacent to the pleural fluid is present bilaterally. Cardiac chamber are enlarged. There is mild prominence of precarinal lymph nodes without bulky lymphadenopathy. Abdomen shows mildly decreased density consistent  with fatty liver infiltration. No focal mass. Numerous calcifications consistent with previous granulomatous disease. Kidney cysts bilaterally. Abdominal aorta exhibits mild failure to taper of its caliber. Maximal measured diameter is 2.3. No other acute abnormalities.   Chest x-ray bilateral diffuse interstitial thickening likely representing interstitial edema versus pneumonitis.   HOSPITAL COURSE: Ms. Zehava Carter is a very nice 81 year old female who has history of severe cardiomyopathy, mostly ischemic dilated with an ejection fraction of 25%. She also has aortic stenosis, atrial fibrillation, coronary artery disease status post stent, and gastroesophageal reflux. The patient was brought to the ER on November 26th with a chief complaint of generalized weakness, dizzy spells, getting really tired, and short of breath. In the ER her BNP was elevated but the patient did not seem to be in any significant cardiac failure. She had just a little bit worsening of her right pleural effusion. The patient became hypotensive and IV fluids were given. She was really symptomatic and actually almost fainted in the ER. The patient was admitted and treated for possible pneumonia with Rocephin and Zithromax. After examination over the next several days since the patient had elevated white blood cells but no significant findings on her x-rays and no cough, the antibiotics were stopped. Urinalysis was repeated and showed increased white blood cells. The patient was also symptomatic with suprapubic tenderness and mild dysuria. At that moment, unfortunately, the cultures did not show any specific pathogen, just mixed bacteria. Since the patient was so symptomatic and had elevated white blood cells we decided to treat with antibiotics empirically. The patient received some ciprofloxacin. The patient continued to complain and be very concerned about her shortness of breath for what we did a CT scan. The CT scan failed to show  any signs of infection or pneumonia, mostly pleural effusions and atelectasis. I had to reassure the patient that this might be effects of her CHF. Her failure is actually significant with an ejection fraction of 25%. The patient did not have a real understanding of what this means for what I spent a lot of time giving her education during this hospitalization. Now she feels like she understands a little bit more of what is going on with her heart. Recommendations have been given to her about "taking it easy", taking significant pauses whenever she does any activity, not try to rush it. The patient, unfortunately, cannot have an ACE inhibitor because of her low blood pressure but she is on a diuretic, beta-blocker, and spironolactone. It was felt that those medications were a little bit more important than Ace Inhibitidf at this moment especially since the patient is 89 and fragile we don't want to drop her blood pressure too much.   As far as her other problems due to her leukocytosis, we followed up on her CBCs and actually they improved and now they are back to normal at 10,000.   As far as her congestive heart failure, the patient needs to monitor her weight, needs to have low salt diet, and needs to follow-up with her cardiologist.   She does have moderate malnutrition. She has always been thin. We recommend to start high protein diet and add Ensure to her meals.   The patient developed thrush during this hospitalization for what she is going to be discharged on nystatin. I am going to give her one dose of Diflucan before sending her home.   Overall she is feeling better. She had a couple episodes of diarrhea during this hospitalization. One was due to Milk of Magnesia and the second one was due to oral contrast on the CT scan but overall there were no signs of infective diarrhea.   As far as her urinary tract infection, her symptoms have resolved. Her blood count is normal and I think that she has  been treated with enough antibiotics for what I am going to stop them now since the patient has received antibiotics for up to six days.   As far as her atrial fibrillation, she been on rate control and she is to continue to take digoxin. Recommend to follow-up levels with Dr. Welton FlakesKhan.   DISPOSITION: Home.   FOLLOW-UP: Follow-up with Dr. Welton FlakesKhan and primary care physician, Dr. Juel BurrowMasoud, within the next week.   MEDICATIONS AT DISCHARGE:  1. Aspirin 81 mg once daily.  2. Furosemide 20 mg once daily.  3. Spironolactone 25 mg once daily. 4. Zofran 4 mg as needed for nausea.  5. Coreg 3.125 mg twice daily.  6. Digoxin 250 mcg once a day. 7. Lactobacillus one twice daily.  8. Ensure on can three times a day. 9. Nystatin oral suspension.   TIME SPENT: I spent about 45 minutes with this discharge today. The patient is going to be discharged with home health.   ____________________________ Felipa Furnaceoberto Sanchez Gutierrez, MD rsg:drc D: 11/13/2012 07:55:28 ET T: 11/13/2012 12:46:47 ET JOB#: [id number]  cc: Felipa Furnaceoberto Sanchez Gutierrez, MD, <Dictator> Corky DownsJaved Masoud, MD Pearletha FurlOBERTO SANCHEZ GUTIERRE MD ELECTRONICALLY SIGNED 11/13/2012 23:08

## 2015-04-07 NOTE — H&P (Signed)
PATIENT NAME:  Sonia Carter, Sonia Carter MR#:  [id number] DATE OF BIRTH:  [birth year]  DATE OF ADMISSION:  06/20/2012  ADMITTING PHYSICIAN: Enid Baasadhika Alton Tremblay, MD  PRIMARY CARE PHYSICIAN: Sonia DownsJaved Masoud, MD  CHIEF COMPLAINT: Difficulty breathing.   HISTORY OF PRESENT ILLNESS: [name] is a very pleasant 81 year old Caucasian female who comes from home secondary to the above-mentioned complaints. The patient lives at home by herself and is very functional at home. She says she even mows her yard. She has been having difficulty with breathing, gradually worsening over the last two weeks, but yesterday it was very worse. She felt restless all night long, could not breathe, and so she came into the hospital this morning. She has been having some dry cough, without any phlegm, over the last couple of weeks. No chest pain, but chest x-ray showed pulmonary edema and her BNP was elevated on admission. So she is being admitted for congestive heart failure exacerbation. She has known history of congestive heart failure and her last ejection fraction in o'clock 2012 seems to be 35%. She has seen Dr. Corky DownsJaved Carter, her PCP, last week. She denies any other associated complaints at this time.   PAST MEDICAL HISTORY:  1. Hypertension.  2. Hyperlipidemia.  3. Coronary artery disease.  4. Gastroesophageal reflux disease.  5. Congestive heart failure with systolic dysfunction and ejection fraction of 35 to 40%.   PAST SURGICAL HISTORY: Hemorrhoid surgery.   SOCIAL HISTORY: She lives at home by herself. She has a son who is present at this time who lives in town. Her sister and sister's daughter live next door and kind of help her too. No smoking or alcohol use.   ALLERGIES: No known drug allergies.   CURRENT HOME MEDICATIONS:  1. Lasix 20 mg p.o. daily.  2. Metoprolol 25 mg p.o. twice a day. 3. Plavix 75 mg p.o. daily.  4. Protonix 40 mg p.o. daily.  5. Aldactone 25 mg p.o. daily.  6. Aspirin 81 mg p.o. daily.   7. Simvastatin 20 mg p.o. daily.  8. Colace 50 mg p.o. daily.   FAMILY HISTORY: Mother died from pneumonia. Father died at an old age from natural causes.   REVIEW OF SYSTEMS: CONSTITUTIONAL: Denies any fever, fatigue, or weakness. EYES: Positive for cataract in the left eye. No inflammation or glaucoma. Uses reading glasses and vision is well-maintained. ENT: Positive for moderate to severe hearing loss in both ears. No tinnitus, ear pain, epistaxis, or discharge. RESPIRATORY: Positive for occasional cough and dyspnea. No wheeze, hemoptysis, or chronic obstructive pulmonary disease. CARDIOVASCULAR: No chest pain. Positive for orthopnea. Positive for pedal edema. No arrhythmia, palpitations, or syncope. GI: No nausea, vomiting, diarrhea, abdominal pain, hematemesis, or melena. GENITOURINARY: No dysuria, hematuria, renal calculus, frequency, or incontinence. ENDOCRINE: No polyuria, nocturia, thyroid problems, heat or cold intolerance. HEMATOLOGY: No anemia, easy bruising, or bleeding. SKIN: No acne, rash, or lesions. MUSCULOSKELETAL: No neck, back, or shoulder pain, arthritis, or gout. NEUROLOGIC: No numbness, weakness, cerebrovascular accident, transient ischemic attack, or seizures. PSYCHOLOGICAL: No anxiety, insomnia, or depression.  PHYSICAL EXAMINATION:  VITALS: Temperature 95.6 degrees Fahrenheit, pulse 91, respirations 30, blood pressure 114/69, and pulse oximetry 93% on room air.   GENERAL: Thin built, well-nourished female lying in bed, not in any acute distress at this time.   HEENT: Normocephalic, atraumatic. Pupils equal, round, and reacting to light. Anicteric sclerae. Extraocular movements intact. Oropharynx clear without erythema, mass, or exudates.   NECK: Supple. No thyromegaly, JVD, or carotid bruits. No lymphadenopathy.  LUNGS: Moving air bilaterally. No use of accessory muscles for breathing. No wheeze. Positive for right basilar crackles, more on the left side.    CARDIOVASCULAR: S1 and S2 regular rate and rhythm. 3/6 systolic murmur. No rubs or gallops.   ABDOMEN: Soft, nontender, and nondistended. No hepatosplenomegaly. Normal bowel sounds.   EXTREMITIES: 1+ pedal edema. No cyanosis or clubbing. 2+ dorsalis pedis pulses palpable bilaterally.   SKIN: No acne, rash, or lesions.   LYMPHATICS: No cervical lymphadenopathy.   NEUROLOGIC: Cranial nerves intact. No focal motor or sensory deficits.   PSYCHOLOGICAL: The patient is awake, alert, and oriented x3.   LABORATORY, DIAGNOSTIC AND RADIOLOGIC DATA: WBC 14.1, hemoglobin 13.0, hematocrit 39.4, and platelet count 184.   Sodium 140, potassium 4.2, chloride 104, bicarbonate 25, BUN 20, creatinine 1.08, glucose 117, and calcium 8.9. BNP is elevated at 12,799. CK 54. CK-MB 0.9. Troponin less than 0.02.  Chest x-ray is showing bilateral diffuse interstitial thickening likely representing interstitial edema versus interstitial pneumonitis secondary to infectious or inflammatory pathology.   EKG is showing normal sinus rhythm, heart rate of 102, on admission. No acute ST-T wave changes.   ASSESSMENT AND PLAN: A 81 year old female with history of hypertension, known congestive heart failure with systolic dysfunction and ejection fraction of 35% admitted for congestive heart failure exacerbation. 1. Acute on chronic congestive heart failure exacerbation with known systolic dysfunction. Last ejection fraction 35%, from October 2012. Admit to telemetry. We will change Lasix to 40 mg IV twice a day and repeat chest x-ray in the a.m. She had an old chest x-ray in June of 2013, this year, which showed similar finding. I am not sure if she has some kind of underlying pulmonary fibrosis and chronic changes superimposed with new pulmonary edema at this time. However, she has seemed to symptomatically improved with one dose of Lasix in the ED. Cardiology consult has been requested. Continue medications of metoprolol,  aspirin, statin, and Aldactone. Added low-dose lisinopril.  2. Hypertension. Continue home medications.  3. Coronary artery disease, currently stable. Continue medical management. Monitor on telemetry. 4. Gastroesophageal reflux disease. The patient is on Protonix.  5. Deep vein thrombosis prophylaxis. On Lovenox.   CODE STATUS: FULL CODE.  TIME SPENT: 50 minutes.  ____________________________ Enid Baas, MD rk:slb D: 06/20/2012 10:20:00 ET T: 06/20/2012 11:17:39 ET JOB#: [id number]  cc: Enid Baas, MD, <Dictator> Sonia Downs, MD Enid Baas MD ELECTRONICALLY SIGNED 06/23/2012 14:23

## 2015-04-07 NOTE — Discharge Summary (Signed)
PATIENT NAME:  [name], Sonia L MR#:  [id number] DATE OF BIRTH:  07-07-1915  DATE OF ADMISSION:  06/20/2012 DATE OF DISCHARGE:  06/22/2012  ADMITTING PHYSICIAN: Dr. Enid Baasadhika Grant Swager DISCHARGING PHYSICIAN: Dr. Enid Baasadhika Mikhaila Roh   PRIMARY CARE PHYSICIAN: Dr. Corky DownsJaved Masoud  CONSULTATIONS IN THE HOSPITAL: Cardiology consultation by Dr. Corky DownsJaved Masoud.   DISCHARGE DIAGNOSES:  1. Acute congestive heart failure exacerbation.  2. Congestive heart failure with systolic dysfunction, ejection fraction of 35%.  3. Hypertension.  4. Gastroesophageal reflux disease.  5. History of coronary artery disease.   DISCHARGE HOME MEDICATIONS:  1. Aspirin 81 mg p.o. daily.  2. Metoprolol 25 mg p.o. b.i.d.  3. Dulcolax 15 mg p.o. daily.  4. Protonix 40 mg p.o. in the morning.  5. Plavix 75 mg p.o. daily.  6. Spironolactone 25 mg p.o. daily.  7. Furosemide 20 mg p.o. b.i.d.  8. Simvastatin 20 mg p.o. daily.  9. Lisinopril 5 mg p.o. daily.   DISCHARGE HOME OXYGEN: None.   DISCHARGE DIET: Low-sodium diet.   DISCHARGE ACTIVITY: As tolerated.     FOLLOWUP INSTRUCTIONS: Primary care physician followup in two weeks.   REFERRALS: Home Health.  LABS AND IMAGING STUDIES PRIOR TO DISCHARGE: WBC 9.2, hemoglobin is 11.6, hematocrit 35, platelet count 151.    Sodium 142, potassium 3.7, chloride 105, bicarbonate 25, BUN 22, creatinine 1.08, glucose 95, calcium 8. Magnesium is 1.8. Hemoglobin A1c 5.8.  LDL 40, HDL 33, triglycerides 89, total cholesterol 91. CK 55, CK-MB 0.8. Troponin less than 0.02. BNP on admission was 12,799.   Chest x-ray on admission showed bilateral diffuse interstitial thickening likely representing interstitial edema versus pneumonitis secondary to infectious or inflammatory etiology. Repeat chest x-ray prior to discharge showed mild improvement in underlying congestive heart failure findings. However, pleural effusions and mild interstitial edema remain.   BRIEF HOSPITAL COURSE: Ms.  Sonia Carter is a 81 year old very pleasant Caucasian female who was living at home by herself and very functional with past medical history significant for coronary artery disease, congestive heart failure, and hypertension who was brought in secondary to worsening dyspnea. Her BNP was elevated and she was also in respiratory distress on initial admission.  1. Acute congestive heart failure exacerbation: Per Dr. Juel BurrowMasoud and the patient's family, the patient is noncompliant with her diet, especially salt ingestion, so probably dietary noncompliance could have been the cause of her congestive heart failure exacerbation. She was on 20 mg daily of Lasix at home and that was increased to 40 mg IV b.i.d. while in the hospital. She was sufficiently diuresed and clinically improved. She was ambulating with physical therapy without any assistance and her breathing has improved a lot. Lasix has been changed to 20 mg p.o. b.i.d. at the time of discharge. She had an echocardiogram in 09/2011 and her last ejection fraction was 35%. She was also on medical treatment for her congestive heart failure with metoprolol. Lisinopril was added. She was already on Aldactone, statin, and aspirin.  2. Hypertension: Blood pressure was on the low normal side; however, Lasix dose was decreased at the time of discharge and also lisinopril was decreased and she is on metoprolol.  3. Coronary artery disease: Appeared stable and her cardiac enzymes were negative while in the hospital.  4. Gastroesophageal reflux disease: The patient is on Protonix at home. Her course has been otherwise uneventful in the hospital.   DISCHARGE CONDITION: Stable.   DISCHARGE DISPOSITION: Home with Home Health nursing and physical therapy.   TIME SPENT ON DISCHARGE:  45 minutes.    ____________________________ Enid Baas, MD rk:bjt D: 06/23/2012 14:27:18 ET T: 06/23/2012 16:30:10 ET JOB#: [id number]  cc: Enid Baas, MD, <Dictator> Corky Downs,  MD Enid Baas MD ELECTRONICALLY SIGNED 06/24/2012 15:46

## 2015-04-07 NOTE — Consult Note (Signed)
PATIENT NAME:  Sonia Carter, Sonia Carter MR#:  [id number] DATE OF BIRTH:  05-22-1915  DATE OF CONSULTATION:  06/20/2012  REFERRING PHYSICIAN:   CONSULTING PHYSICIAN:  Corky DownsJaved Shaughn Thomley, MD  HISTORY OF PRESENT ILLNESS: with congestive heart failure. . Patient has not been following her diet and has been eating bacon, sausage, potato soup and also sometimes uses salt shaker. She does not give any history of chest pain, fever, chills. Denies any history of palpitation. Patient has long-standing atrial fibrillation, has not been on Coumadin because of high risk. She has multiple admissions in this hospital with congestive heart failure. Has also been palliative care before.   PAST MEDICAL HISTORY:  1. Hypertension.  2. Dyslipidemia.  3. Coronary artery disease.  4. Old history of myocardial infarction. 5. Gastroesophageal reflux disease. 6. Cardiomyopathy with poor left ventricular ejection fraction of 35% to 40%.   ALLERGIES: Not known.   SOCIAL HISTORY: Patient lives with her son. Cooks her own food.    MEDICATIONS:  1. Diuretic. 2. Beta blocker. 3. Plavix. 4. Protonix. 5. Aldactone. 6. Simvastatin.  PHYSICAL EXAMINATION:  GENERAL: Patient is alert, cooperative female. Denies any history of chest pain.   HEENT: Head normocephalic. Pupils reactive. Sclerae nonicteric. Tongue is moist, papillated.   NECK: Supple. Jugular venous pressure is elevated about 4 cm over the sternal angle. Carotid upstroke is 2+ without any bruit. There is no goiter. No lymphadenopathy. Trachea is centered.   CARDIOVASCULAR: Apical impulse is palpable in the fifth intercostal space, it was heaving in character in the left lateral decubitus position.   CHEST: Decreased breath sounds with few rhonchi.   ABDOMEN: Soft, nontender.   IMPRESSION: Congestive heart failure with pleural effusion, pulmonary fibrosis. Patient is on optimum treatment for congestive heart failure. I discussed in detail with the patient the  importance of following low salt diet and excluding from her food sausage, bacon and do not use any salt shaker. She is not on anticoagulation for atrial fibrillation because of the high risk of bleeding so we will continue with the Plavix. I will suggest to continue her present medications and increase the diuretics to 20 mg p.o. twice a day. She should be followed up by home agency for congestive heart failure monitoring protocol because of her low ejection fraction, coronary artery disease and ischemic cardiomyopathy. She is high risk of coming back to the hospital. I have tried to convince her to follow low salt diet. Obviously this admission was secondary to noncompliance with her diet. I will follow her on an outpatient basis and if she is still decompensated then we can use Ranexa starting initially at 500 mg p.o. daily. I have not started that in the past because of patient's dizziness and falls but it can be used as an adjunctive therapy in the low dosage.   ____________________________ Corky DownsJaved Jatavius Ellenwood, MD jm:cms D: 06/20/2012 19:36:18 ET T: 06/21/2012 09:34:17 ET JOB#: [id number]  cc: Corky DownsJaved Amelio Brosky, MD, <Dictator> Corky DownsJAVED Shakeila Pfarr MD ELECTRONICALLY SIGNED 06/28/2012 18:34
